# Patient Record
Sex: Female | Born: 1965 | Race: White | Hispanic: No | Marital: Single | Smoking: Current every day smoker
Health system: Southern US, Community
[De-identification: ages and names within clinical notes are randomized; demographics above are authoritative.]

## PROBLEM LIST (undated history)

## (undated) DIAGNOSIS — K5790 Diverticulosis of intestine, part unspecified, without perforation or abscess without bleeding: Secondary | ICD-10-CM

## (undated) DIAGNOSIS — E039 Hypothyroidism, unspecified: Secondary | ICD-10-CM

## (undated) DIAGNOSIS — I10 Essential (primary) hypertension: Secondary | ICD-10-CM

## (undated) HISTORY — PX: CHOLECYSTECTOMY: SHX55

---

## 2014-03-01 ENCOUNTER — Encounter (HOSPITAL_BASED_OUTPATIENT_CLINIC_OR_DEPARTMENT_OTHER): Payer: Self-pay | Admitting: Emergency Medicine

## 2014-03-01 ENCOUNTER — Emergency Department (HOSPITAL_BASED_OUTPATIENT_CLINIC_OR_DEPARTMENT_OTHER)
Admission: EM | Admit: 2014-03-01 | Discharge: 2014-03-01 | Disposition: A | Discharge status: Home / Self Care | Payer: Self-pay | Attending: Emergency Medicine | Admitting: Emergency Medicine

## 2014-03-01 ENCOUNTER — Emergency Department (HOSPITAL_BASED_OUTPATIENT_CLINIC_OR_DEPARTMENT_OTHER): Payer: Self-pay

## 2014-03-01 DIAGNOSIS — R059 Cough, unspecified: Secondary | ICD-10-CM

## 2014-03-01 DIAGNOSIS — J029 Acute pharyngitis, unspecified: Secondary | ICD-10-CM | POA: Insufficient documentation

## 2014-03-01 DIAGNOSIS — Z720 Tobacco use: Secondary | ICD-10-CM | POA: Insufficient documentation

## 2014-03-01 DIAGNOSIS — Z79899 Other long term (current) drug therapy: Secondary | ICD-10-CM | POA: Insufficient documentation

## 2014-03-01 DIAGNOSIS — Z8719 Personal history of other diseases of the digestive system: Secondary | ICD-10-CM | POA: Insufficient documentation

## 2014-03-01 DIAGNOSIS — E039 Hypothyroidism, unspecified: Secondary | ICD-10-CM | POA: Insufficient documentation

## 2014-03-01 DIAGNOSIS — R079 Chest pain, unspecified: Secondary | ICD-10-CM | POA: Insufficient documentation

## 2014-03-01 DIAGNOSIS — R05 Cough: Secondary | ICD-10-CM | POA: Insufficient documentation

## 2014-03-01 DIAGNOSIS — I10 Essential (primary) hypertension: Secondary | ICD-10-CM | POA: Insufficient documentation

## 2014-03-01 HISTORY — DX: Hypothyroidism, unspecified: E03.9

## 2014-03-01 HISTORY — DX: Essential (primary) hypertension: I10

## 2014-03-01 HISTORY — DX: Diverticulosis of intestine, part unspecified, without perforation or abscess without bleeding: K57.90

## 2014-03-01 LAB — CBC WITH DIFFERENTIAL/PLATELET
BASOS ABS: 0 10*3/uL (ref 0.0–0.1)
BASOS PCT: 0 % (ref 0–1)
EOS PCT: 3 % (ref 0–5)
Eosinophils Absolute: 0.2 10*3/uL (ref 0.0–0.7)
HCT: 39.6 % (ref 36.0–46.0)
Hemoglobin: 13 g/dL (ref 12.0–15.0)
LYMPHS PCT: 28 % (ref 12–46)
Lymphs Abs: 2.4 10*3/uL (ref 0.7–4.0)
MCH: 28.3 pg (ref 26.0–34.0)
MCHC: 32.8 g/dL (ref 30.0–36.0)
MCV: 86.3 fL (ref 78.0–100.0)
Monocytes Absolute: 0.6 10*3/uL (ref 0.1–1.0)
Monocytes Relative: 7 % (ref 3–12)
Neutro Abs: 5.3 10*3/uL (ref 1.7–7.7)
Neutrophils Relative %: 62 % (ref 43–77)
PLATELETS: 254 10*3/uL (ref 150–400)
RBC: 4.59 MIL/uL (ref 3.87–5.11)
RDW: 14.9 % (ref 11.5–15.5)
WBC: 8.5 10*3/uL (ref 4.0–10.5)

## 2014-03-01 LAB — BASIC METABOLIC PANEL
ANION GAP: 14 (ref 5–15)
BUN: 10 mg/dL (ref 6–23)
CALCIUM: 9 mg/dL (ref 8.4–10.5)
CO2: 22 mEq/L (ref 19–32)
Chloride: 102 mEq/L (ref 96–112)
Creatinine, Ser: 0.8 mg/dL (ref 0.50–1.10)
GFR, EST NON AFRICAN AMERICAN: 85 mL/min — AB (ref >90)
Glucose, Bld: 106 mg/dL — ABNORMAL HIGH (ref 70–99)
Potassium: 4 mEq/L (ref 3.7–5.3)
SODIUM: 138 meq/L (ref 137–147)

## 2014-03-01 LAB — TROPONIN I

## 2014-03-01 NOTE — ED Provider Notes (Signed)
CSN: [id number]     Arrival date & time 03/01/14  [id number] History   First MD Initiated Contact with Patient 03/01/14 [phone]     Chief Complaint  Patient presents with  . Chest Pain  . Cough     (Consider location/radiation/quality/duration/timing/severity/associated sxs/prior Treatment) Patient is a 48 y.o. female presenting with cough.  Cough Cough characteristics:  Productive Sputum characteristics:  Nondescript Severity:  Moderate Onset quality:  Gradual Duration:  2 weeks Timing:  Intermittent Progression:  Unchanged Chronicity:  New Smoker: yes   Context comment:  Had URI symptoms, fever a few weeks ago.  now has persistent cough. Relieved by: mucinex. Worsened by:  Nothing tried Associated symptoms: chest pain (left sided, when coughing), shortness of breath (mild) and sore throat (mild)   Associated symptoms: no fever     Past Medical History  Diagnosis Date  . Hypertension   . Hypothyroidism   . Diverticulosis    Past Surgical History  Procedure Laterality Date  . Cholecystectomy     No family history on file. History  Substance Use Topics  . Smoking status: Current Every Day Smoker  . Smokeless tobacco: Not on file  . Alcohol Use: Not on file   OB History    No data available     Review of Systems  Constitutional: Negative for fever.  HENT: Positive for sore throat (mild).   Respiratory: Positive for cough and shortness of breath (mild).   Cardiovascular: Positive for chest pain (left sided, when coughing).  All other systems reviewed and are negative.     Allergies  Codeine  Home Medications   Prior to Admission medications   Medication Sig Start Date End Date Taking? Authorizing Provider  levothyroxine (SYNTHROID, LEVOTHROID) 75 MCG tablet Take 75 mcg by mouth daily before breakfast.   Yes Historical Provider, MD  lisinopril (PRINIVIL,ZESTRIL) 20 MG tablet Take 20 mg by mouth daily as needed (for systolic BP over 478140).   Yes Historical  Provider, MD   BP 170/90 mmHg  Pulse 84  Temp(Src) 97.8 F (36.6 C) (Oral)  Resp 20  Ht 5' (1.524 m)  Wt 132 lb (59.875 kg)  BMI 25.78 kg/m2  SpO2 98%  LMP 02/09/2014 Physical Exam  Constitutional: She is oriented to person, place, and time. She appears well-developed and well-nourished. No distress.  HENT:  Head: Normocephalic and atraumatic.  Mouth/Throat: Oropharynx is clear and moist.  Eyes: Conjunctivae are normal. Pupils are equal, round, and reactive to light. No scleral icterus.  Neck: Neck supple.  Cardiovascular: Normal rate, regular rhythm, normal heart sounds and intact distal pulses.   No murmur heard. Pulmonary/Chest: Effort normal and breath sounds normal. No stridor. No respiratory distress. She has no wheezes. She has no rales. She exhibits tenderness (mild left upper anterior).  Abdominal: Soft. Bowel sounds are normal. She exhibits no distension. There is no tenderness.  Musculoskeletal: Normal range of motion.  Neurological: She is alert and oriented to person, place, and time.  Skin: Skin is warm and dry. No rash noted.  Psychiatric: She has a normal mood and affect. Her behavior is normal.  Nursing note and vitals reviewed.   ED Course  Procedures (including critical care time) Labs Review Labs Reviewed  BASIC METABOLIC PANEL - Abnormal; Notable for the following:    Glucose, Bld 106 (*)    GFR calc non Af Amer 85 (*)    All other components within normal limits  CBC WITH DIFFERENTIAL  TROPONIN I  Imaging Review Dg Chest 2 View  03/01/2014   CLINICAL DATA:  Cough.  Chest pain.  EXAM: CHEST  2 VIEW  COMPARISON:  None.  FINDINGS: The heart size and mediastinal contours are within normal limits. Both lungs are clear. No pneumothorax or pleural effusion is noted. The visualized skeletal structures are unremarkable.  IMPRESSION: No acute cardiopulmonary abnormality seen.   Electronically Signed   By: Roque LiasJames  Green M.D.   On: 03/01/2014 10:25  All  radiology studies independently viewed by me.      EKG Interpretation   Date/Time:  Wednesday March 01 2014 09:12:14 EST Ventricular Rate:  76 PR Interval:  150 QRS Duration: 90 QT Interval:  362 QTC Calculation: 407 R Axis:   80 Text Interpretation:  Normal sinus rhythm Normal ECG No old tracing to  compare Confirmed by [facility]  MD, TREY ([id number]) on 03/01/2014 9:50:13 AM      MDM   Final diagnoses:  Cough    Well appearing 48 yo female with cough for a few weeks.  Now having pain when coughing.  History is inconsistent with ACS or PE.  Low risk by [facility].  No increased WOB or hypoxia.  Plan CXR and labs.  Suspect post viral cough perpetuated by smoking.  Advised she quit smoking.    Labwork and CXR negative.  Advised supportive treatment.  Appears stable for discharge home with outpatient follow up.    Warnell Foresterrey Jaqua Ching, MD 03/01/14 [phone]

## 2014-03-01 NOTE — Discharge Instructions (Signed)
Cough, Adult  A cough is a reflex that helps clear your throat and airways. It can help heal the body or may be a reaction to an irritated airway. A cough may only last 2 or 3 weeks (acute) or may last more than 8 weeks (chronic).  CAUSES Acute cough:  Viral or bacterial infections. Chronic cough:  Infections.  Allergies.  Asthma.  Post-nasal drip.  Smoking.  Heartburn or acid reflux.  Some medicines.  Chronic lung problems (COPD).  Cancer. SYMPTOMS   Cough.  Fever.  Chest pain.  Increased breathing rate.  High-pitched whistling sound when breathing (wheezing).  Colored mucus that you cough up (sputum). TREATMENT   A bacterial cough may be treated with antibiotic medicine.  A viral cough must run its course and will not respond to antibiotics.  Your caregiver may recommend other treatments if you have a chronic cough. HOME CARE INSTRUCTIONS   Only take over-the-counter or prescription medicines for pain, discomfort, or fever as directed by your caregiver. Use cough suppressants only as directed by your caregiver.  Use a cold steam vaporizer or humidifier in your bedroom or home to help loosen secretions.  Sleep in a semi-upright position if your cough is worse at night.  Rest as needed.  Stop smoking if you smoke. SEEK IMMEDIATE MEDICAL CARE IF:   You have pus in your sputum.  Your cough starts to worsen.  You cannot control your cough with suppressants and are losing sleep.  You begin coughing up blood.  You have difficulty breathing.  You develop pain which is getting worse or is uncontrolled with medicine.  You have a fever. MAKE SURE YOU:   Understand these instructions.  Will watch your condition.  Will get help right away if you are not doing well or get worse. Document Released: 09/13/2010 Document Revised: 06/09/2011 Document Reviewed: 09/13/2010 [employer] Patient Information 2015 [employer]. This information is not intended  to replace advice given to you by your health care provider. Make sure you discuss any questions you have with your health care provider.  

## 2014-03-01 NOTE — ED Notes (Signed)
Pt states she has been having cold symptoms for 2-3 weeks, cough and chest pain over one week.  Some sob, some lightheadedness.  No known fever.

## 2015-12-29 IMAGING — CR DG CHEST 2V
2 series · 2 of 2 positions shown · non-contrast
Comparison: None.

CLINICAL DATA: Cough.  Chest pain.

EXAM:
CHEST  2 VIEW

[w chest pa]
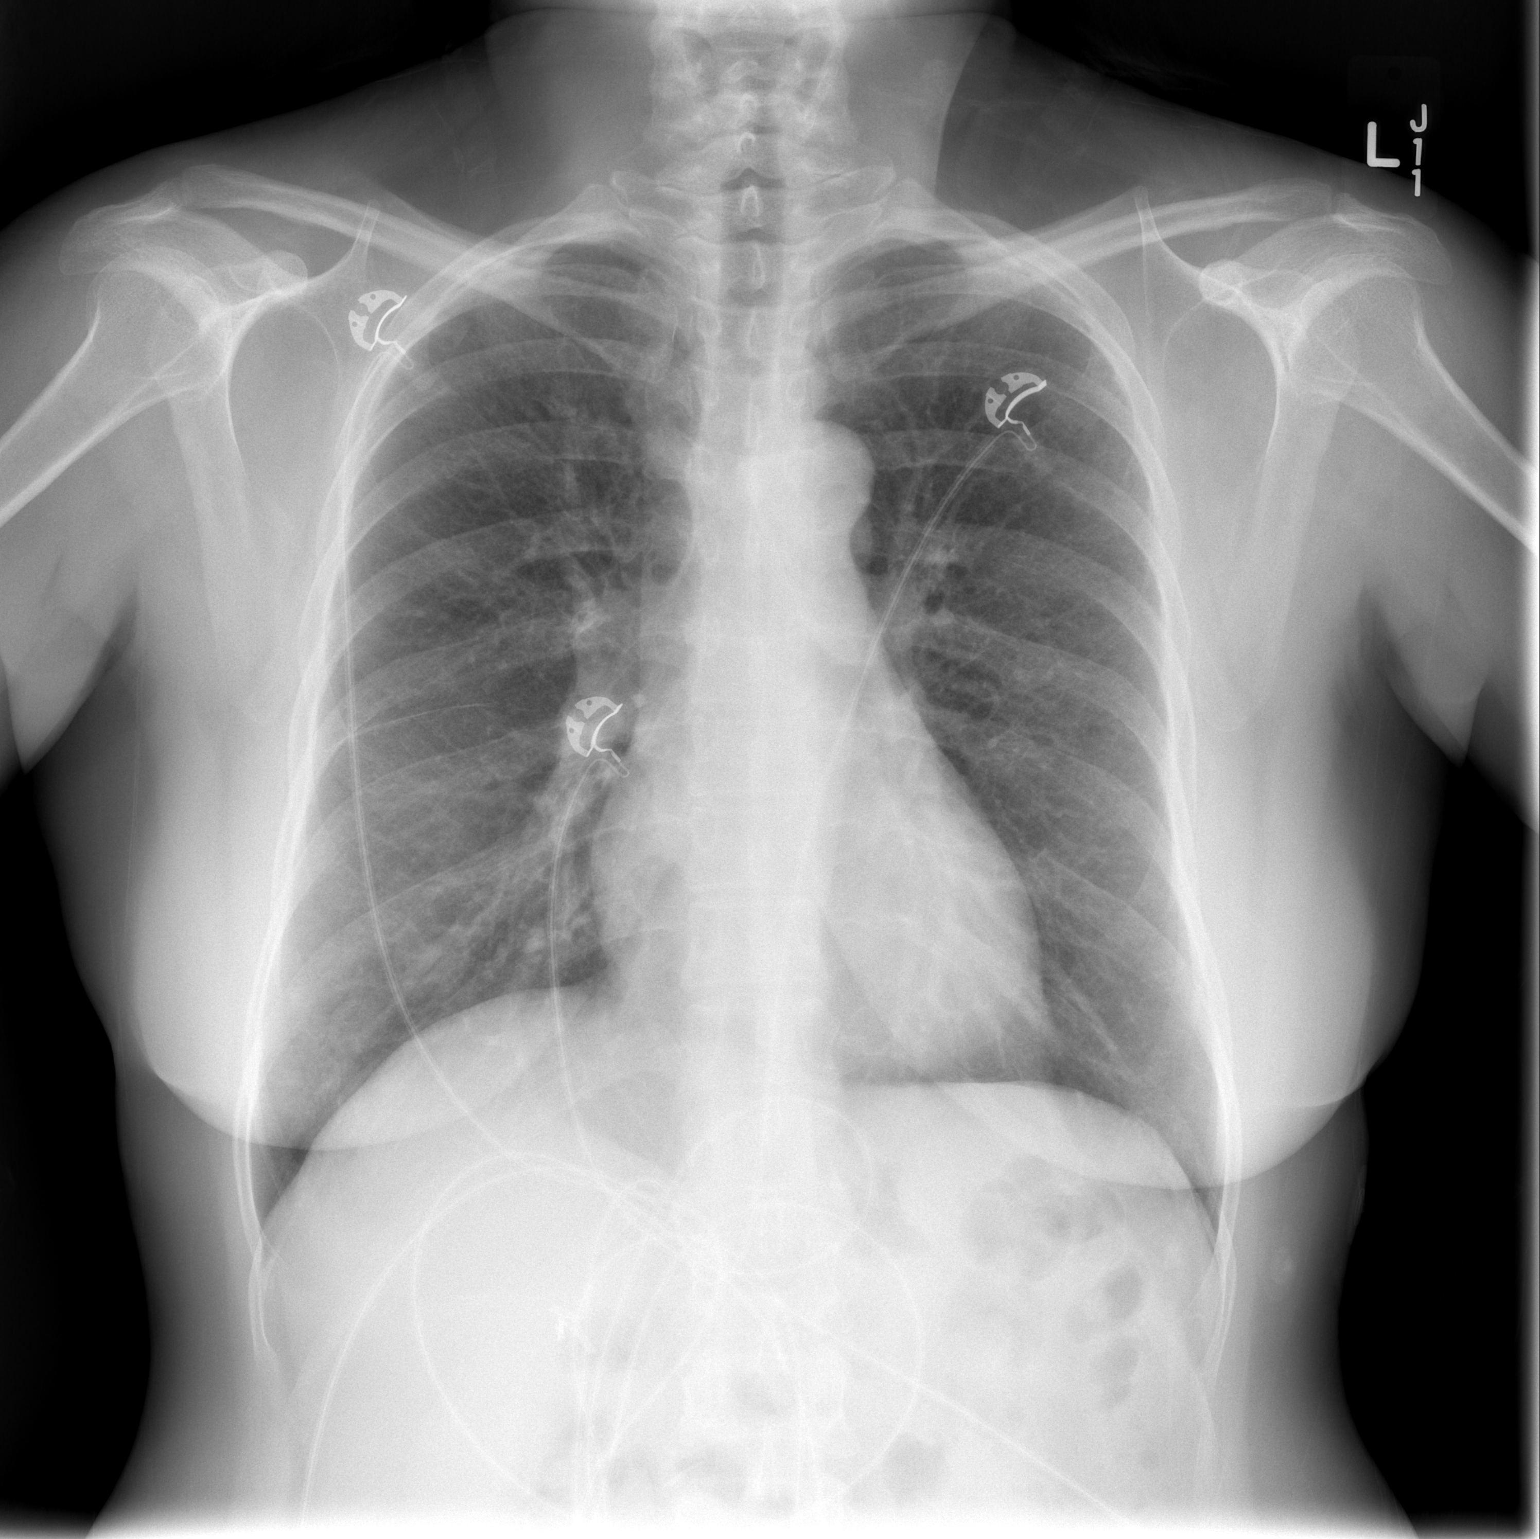

[w chest lat]
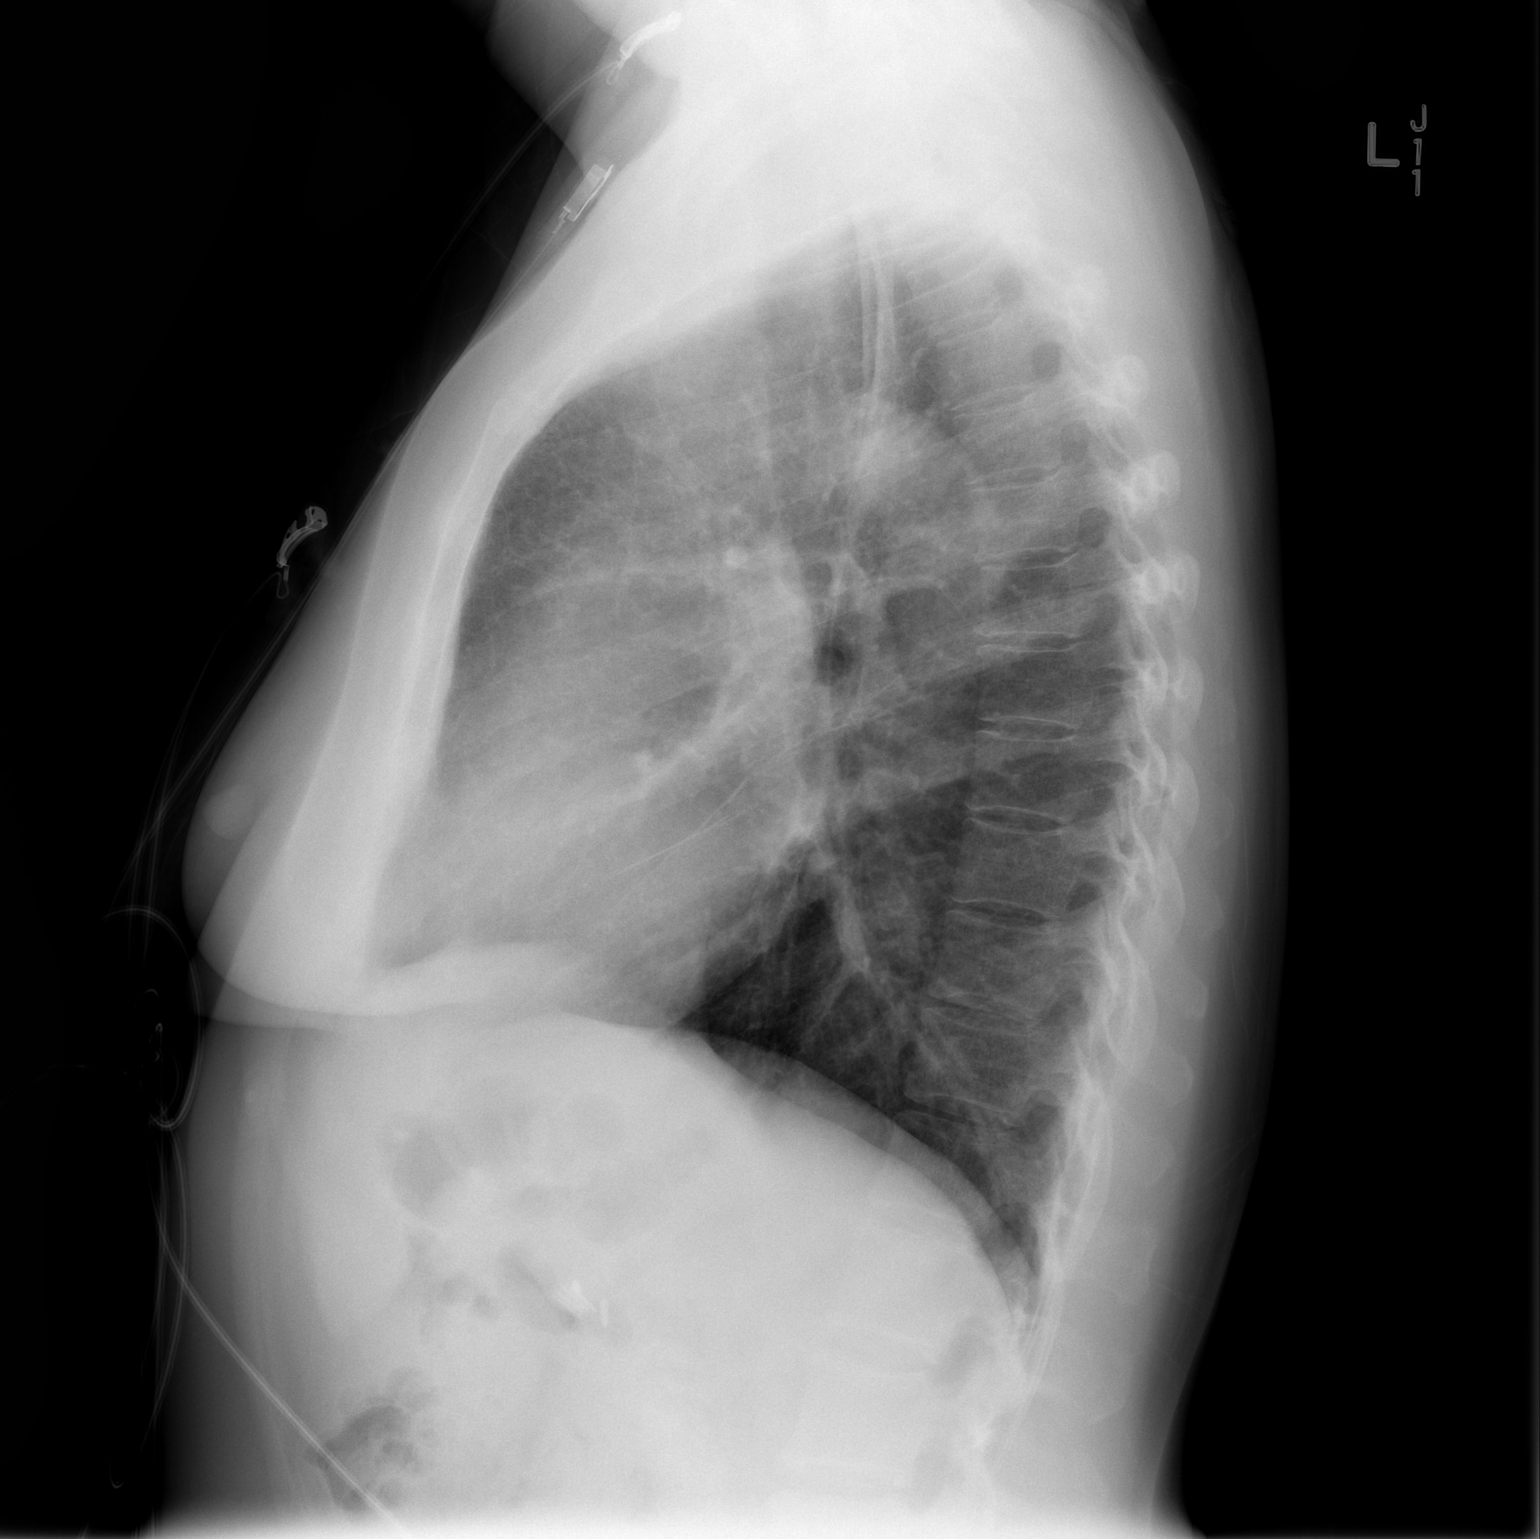

[2 of 2 positions shown; findings below may reference images not displayed]

FINDINGS: The heart size and mediastinal contours are within normal limits.
Both lungs are clear. No pneumothorax or pleural effusion is noted.
The visualized skeletal structures are unremarkable.
IMPRESSION: No acute cardiopulmonary abnormality seen.
